# Patient Record
Sex: Female | Born: 1940 | Race: Black or African American | Hispanic: No | Marital: Married
Health system: Southern US, Community
[De-identification: ages and names within clinical notes are randomized; demographics above are authoritative.]

---

## 2004-07-27 ENCOUNTER — Ambulatory Visit: Payer: Self-pay | Admitting: Internal Medicine

## 2005-06-11 ENCOUNTER — Ambulatory Visit: Payer: Self-pay | Admitting: Internal Medicine

## 2005-07-02 ENCOUNTER — Ambulatory Visit: Payer: Self-pay | Admitting: Oncology

## 2005-07-05 ENCOUNTER — Ambulatory Visit: Payer: Self-pay | Admitting: General Surgery

## 2005-07-18 ENCOUNTER — Ambulatory Visit: Payer: Self-pay | Admitting: Oncology

## 2005-08-15 ENCOUNTER — Ambulatory Visit: Payer: Self-pay | Admitting: Oncology

## 2005-09-15 ENCOUNTER — Ambulatory Visit: Payer: Self-pay | Admitting: Oncology

## 2005-10-15 ENCOUNTER — Ambulatory Visit: Payer: Self-pay | Admitting: Oncology

## 2005-11-15 ENCOUNTER — Ambulatory Visit: Payer: Self-pay | Admitting: Oncology

## 2005-11-28 ENCOUNTER — Ambulatory Visit: Payer: Self-pay | Admitting: General Surgery

## 2005-12-27 ENCOUNTER — Ambulatory Visit: Payer: Self-pay | Admitting: Oncology

## 2006-01-15 ENCOUNTER — Ambulatory Visit: Payer: Self-pay | Admitting: Oncology

## 2006-02-15 ENCOUNTER — Ambulatory Visit: Payer: Self-pay | Admitting: Oncology

## 2006-03-17 ENCOUNTER — Ambulatory Visit: Payer: Self-pay | Admitting: Oncology

## 2006-04-17 ENCOUNTER — Ambulatory Visit: Payer: Self-pay | Admitting: Oncology

## 2006-05-17 ENCOUNTER — Ambulatory Visit: Payer: Self-pay | Admitting: Oncology

## 2006-07-04 ENCOUNTER — Ambulatory Visit: Payer: Self-pay | Admitting: General Surgery

## 2006-07-11 ENCOUNTER — Ambulatory Visit: Payer: Self-pay | Admitting: Oncology

## 2006-07-18 ENCOUNTER — Ambulatory Visit: Payer: Self-pay | Admitting: Oncology

## 2006-09-16 ENCOUNTER — Ambulatory Visit: Payer: Self-pay | Admitting: Oncology

## 2006-09-17 ENCOUNTER — Ambulatory Visit: Payer: Self-pay | Admitting: Oncology

## 2006-10-16 ENCOUNTER — Ambulatory Visit: Payer: Self-pay | Admitting: Oncology

## 2007-01-08 ENCOUNTER — Ambulatory Visit: Payer: Self-pay | Admitting: Oncology

## 2007-01-16 ENCOUNTER — Ambulatory Visit: Payer: Self-pay | Admitting: Oncology

## 2007-02-16 ENCOUNTER — Ambulatory Visit: Payer: Self-pay | Admitting: Oncology

## 2007-02-19 ENCOUNTER — Ambulatory Visit: Payer: Self-pay | Admitting: General Surgery

## 2007-03-19 ENCOUNTER — Ambulatory Visit: Payer: Self-pay | Admitting: Radiation Oncology

## 2007-04-18 ENCOUNTER — Ambulatory Visit: Payer: Self-pay | Admitting: Radiation Oncology

## 2007-06-18 ENCOUNTER — Ambulatory Visit: Payer: Self-pay | Admitting: Oncology

## 2007-07-01 ENCOUNTER — Ambulatory Visit: Payer: Self-pay | Admitting: General Surgery

## 2007-07-10 ENCOUNTER — Ambulatory Visit: Payer: Self-pay | Admitting: Oncology

## 2007-07-19 ENCOUNTER — Ambulatory Visit: Payer: Self-pay | Admitting: Oncology

## 2007-11-16 ENCOUNTER — Ambulatory Visit: Payer: Self-pay | Admitting: Oncology

## 2007-12-16 ENCOUNTER — Ambulatory Visit: Payer: Self-pay | Admitting: Oncology

## 2008-01-11 ENCOUNTER — Ambulatory Visit: Payer: Self-pay | Admitting: Internal Medicine

## 2008-01-18 ENCOUNTER — Emergency Department: Payer: Self-pay | Admitting: Internal Medicine

## 2008-01-18 ENCOUNTER — Other Ambulatory Visit: Payer: Self-pay

## 2008-02-16 ENCOUNTER — Ambulatory Visit: Payer: Self-pay | Admitting: Radiation Oncology

## 2008-03-16 ENCOUNTER — Ambulatory Visit: Payer: Self-pay | Admitting: Oncology

## 2008-03-17 ENCOUNTER — Ambulatory Visit: Payer: Self-pay | Admitting: Oncology

## 2008-04-15 ENCOUNTER — Ambulatory Visit: Payer: Self-pay | Admitting: Oncology

## 2008-04-17 ENCOUNTER — Ambulatory Visit: Payer: Self-pay | Admitting: Oncology

## 2008-07-04 ENCOUNTER — Ambulatory Visit: Payer: Self-pay | Admitting: Internal Medicine

## 2008-10-15 ENCOUNTER — Ambulatory Visit: Payer: Self-pay | Admitting: Oncology

## 2008-10-19 ENCOUNTER — Ambulatory Visit: Payer: Self-pay | Admitting: Oncology

## 2008-11-15 ENCOUNTER — Ambulatory Visit: Payer: Self-pay | Admitting: Oncology

## 2009-03-17 ENCOUNTER — Ambulatory Visit: Payer: Self-pay | Admitting: Radiation Oncology

## 2009-03-23 ENCOUNTER — Ambulatory Visit: Payer: Self-pay | Admitting: Radiation Oncology

## 2009-04-17 ENCOUNTER — Ambulatory Visit: Payer: Self-pay | Admitting: Radiation Oncology

## 2009-04-19 ENCOUNTER — Ambulatory Visit: Payer: Self-pay | Admitting: Oncology

## 2009-05-16 ENCOUNTER — Ambulatory Visit: Payer: Self-pay | Admitting: General Surgery

## 2009-05-17 ENCOUNTER — Ambulatory Visit: Payer: Self-pay | Admitting: Radiation Oncology

## 2009-05-17 ENCOUNTER — Ambulatory Visit: Payer: Self-pay | Admitting: Oncology

## 2009-05-18 ENCOUNTER — Ambulatory Visit: Payer: Self-pay | Admitting: Oncology

## 2009-06-17 ENCOUNTER — Ambulatory Visit: Payer: Self-pay | Admitting: Oncology

## 2009-06-17 ENCOUNTER — Ambulatory Visit: Payer: Self-pay | Admitting: Radiation Oncology

## 2009-07-06 ENCOUNTER — Ambulatory Visit: Payer: Self-pay | Admitting: Internal Medicine

## 2009-07-18 ENCOUNTER — Ambulatory Visit: Payer: Self-pay | Admitting: Oncology

## 2009-07-18 ENCOUNTER — Ambulatory Visit: Payer: Self-pay | Admitting: Radiation Oncology

## 2009-08-15 ENCOUNTER — Ambulatory Visit: Payer: Self-pay | Admitting: Oncology

## 2009-08-15 ENCOUNTER — Ambulatory Visit: Payer: Self-pay | Admitting: Radiation Oncology

## 2009-09-15 ENCOUNTER — Ambulatory Visit: Payer: Self-pay | Admitting: Radiation Oncology

## 2009-09-15 ENCOUNTER — Ambulatory Visit: Payer: Self-pay | Admitting: Oncology

## 2009-10-15 ENCOUNTER — Ambulatory Visit: Payer: Self-pay | Admitting: Radiation Oncology

## 2009-10-15 ENCOUNTER — Ambulatory Visit: Payer: Self-pay | Admitting: Oncology

## 2009-11-15 ENCOUNTER — Ambulatory Visit: Payer: Self-pay | Admitting: Radiation Oncology

## 2009-11-15 ENCOUNTER — Ambulatory Visit: Payer: Self-pay | Admitting: Oncology

## 2009-12-15 ENCOUNTER — Ambulatory Visit: Payer: Self-pay | Admitting: Radiation Oncology

## 2010-01-09 ENCOUNTER — Ambulatory Visit: Payer: Self-pay | Admitting: Oncology

## 2010-01-11 LAB — CANCER ANTIGEN 27.29: CA 27.29: 27.3 U/mL (ref 0.0–38.6)

## 2010-01-15 ENCOUNTER — Ambulatory Visit: Payer: Self-pay | Admitting: Radiation Oncology

## 2010-01-15 ENCOUNTER — Ambulatory Visit: Payer: Self-pay | Admitting: Oncology

## 2010-02-15 ENCOUNTER — Ambulatory Visit: Payer: Self-pay | Admitting: Radiation Oncology

## 2010-02-15 ENCOUNTER — Ambulatory Visit: Payer: Self-pay | Admitting: Oncology

## 2010-03-17 ENCOUNTER — Ambulatory Visit: Payer: Self-pay | Admitting: Oncology

## 2010-03-17 ENCOUNTER — Ambulatory Visit: Payer: Self-pay | Admitting: Radiation Oncology

## 2010-03-22 LAB — CANCER ANTIGEN 27.29: CA 27.29: 21.4 U/mL (ref 0.0–38.6)

## 2010-04-17 ENCOUNTER — Ambulatory Visit: Payer: Self-pay | Admitting: Oncology

## 2010-04-17 ENCOUNTER — Ambulatory Visit: Payer: Self-pay | Admitting: Radiation Oncology

## 2010-04-19 LAB — CANCER ANTIGEN 27.29: CA 27.29: 19.9 U/mL (ref 0.0–38.6)

## 2010-05-17 ENCOUNTER — Ambulatory Visit: Payer: Self-pay | Admitting: Oncology

## 2010-05-17 ENCOUNTER — Ambulatory Visit: Payer: Self-pay | Admitting: Radiation Oncology

## 2010-06-14 LAB — CANCER ANTIGEN 27.29: CA 27.29: 19.1 U/mL (ref 0.0–38.6)

## 2010-06-17 ENCOUNTER — Ambulatory Visit: Payer: Self-pay | Admitting: Radiation Oncology

## 2010-07-04 ENCOUNTER — Ambulatory Visit: Payer: Self-pay | Admitting: Oncology

## 2010-07-10 ENCOUNTER — Ambulatory Visit: Payer: Self-pay | Admitting: Internal Medicine

## 2010-07-11 ENCOUNTER — Ambulatory Visit: Payer: Self-pay | Admitting: Oncology

## 2010-07-16 ENCOUNTER — Encounter: Payer: Self-pay | Admitting: Nurse Practitioner

## 2010-07-18 ENCOUNTER — Ambulatory Visit: Payer: Self-pay | Admitting: Radiation Oncology

## 2010-07-18 ENCOUNTER — Ambulatory Visit: Payer: Self-pay | Admitting: Oncology

## 2010-07-20 ENCOUNTER — Encounter: Payer: Self-pay | Admitting: Nurse Practitioner

## 2010-08-16 ENCOUNTER — Ambulatory Visit: Payer: Self-pay | Admitting: Radiation Oncology

## 2010-08-17 ENCOUNTER — Encounter: Payer: Self-pay | Admitting: Nurse Practitioner

## 2010-08-26 ENCOUNTER — Emergency Department: Payer: Self-pay | Admitting: Emergency Medicine

## 2010-08-28 ENCOUNTER — Ambulatory Visit: Payer: Self-pay | Admitting: Oncology

## 2010-09-12 ENCOUNTER — Ambulatory Visit: Payer: Self-pay | Admitting: Oncology

## 2010-09-16 ENCOUNTER — Ambulatory Visit: Payer: Self-pay | Admitting: Oncology

## 2010-09-16 ENCOUNTER — Ambulatory Visit: Payer: Self-pay | Admitting: Radiation Oncology

## 2010-10-03 ENCOUNTER — Encounter: Payer: Self-pay | Admitting: Nurse Practitioner

## 2010-10-16 ENCOUNTER — Ambulatory Visit: Payer: Self-pay | Admitting: Radiation Oncology

## 2010-10-17 ENCOUNTER — Encounter: Payer: Self-pay | Admitting: Nurse Practitioner

## 2010-10-18 ENCOUNTER — Ambulatory Visit: Payer: Self-pay | Admitting: Oncology

## 2010-11-16 ENCOUNTER — Ambulatory Visit: Payer: Self-pay | Admitting: Oncology

## 2010-11-16 ENCOUNTER — Ambulatory Visit: Payer: Self-pay | Admitting: Radiation Oncology

## 2010-11-22 DIAGNOSIS — Z09 Encounter for follow-up examination after completed treatment for conditions other than malignant neoplasm: Secondary | ICD-10-CM

## 2010-12-16 ENCOUNTER — Ambulatory Visit: Payer: Self-pay | Admitting: Oncology

## 2010-12-16 ENCOUNTER — Ambulatory Visit: Payer: Self-pay | Admitting: Radiation Oncology

## 2011-01-16 ENCOUNTER — Ambulatory Visit: Payer: Self-pay | Admitting: Oncology

## 2011-01-16 ENCOUNTER — Ambulatory Visit: Payer: Self-pay | Admitting: Radiation Oncology

## 2011-02-16 ENCOUNTER — Ambulatory Visit: Payer: Self-pay | Admitting: Oncology

## 2011-02-16 ENCOUNTER — Ambulatory Visit: Payer: Self-pay | Admitting: Radiation Oncology

## 2011-03-18 ENCOUNTER — Ambulatory Visit: Payer: Self-pay | Admitting: Radiation Oncology

## 2011-03-18 ENCOUNTER — Ambulatory Visit: Payer: Self-pay | Admitting: Oncology

## 2011-04-16 ENCOUNTER — Ambulatory Visit: Payer: Self-pay | Admitting: Oncology

## 2011-04-18 ENCOUNTER — Ambulatory Visit: Payer: Self-pay | Admitting: Oncology

## 2011-04-18 ENCOUNTER — Ambulatory Visit: Payer: Self-pay | Admitting: Radiation Oncology

## 2011-05-18 ENCOUNTER — Ambulatory Visit: Payer: Self-pay | Admitting: Radiation Oncology

## 2011-05-18 ENCOUNTER — Ambulatory Visit: Payer: Self-pay | Admitting: Oncology

## 2011-05-24 ENCOUNTER — Encounter: Payer: Self-pay | Admitting: Nurse Practitioner

## 2011-05-24 ENCOUNTER — Encounter: Payer: Self-pay | Admitting: Cardiothoracic Surgery

## 2011-06-18 ENCOUNTER — Ambulatory Visit: Payer: Self-pay | Admitting: Oncology

## 2011-06-18 ENCOUNTER — Ambulatory Visit: Payer: Self-pay | Admitting: Radiation Oncology

## 2011-06-21 ENCOUNTER — Ambulatory Visit: Payer: Self-pay | Admitting: Oncology

## 2011-06-21 LAB — CBC CANCER CENTER
Basophil #: 0 x10 3/mm (ref 0.0–0.1)
HGB: 8.7 g/dL — ABNORMAL LOW (ref 12.0–16.0)
Lymphocyte #: 0.6 x10 3/mm — ABNORMAL LOW (ref 1.0–3.6)
MCH: 32.1 pg (ref 26.0–34.0)
MCV: 95 fL (ref 80–100)
Monocyte #: 1 x10 3/mm — ABNORMAL HIGH (ref 0.0–0.7)
Neutrophil %: 42.3 %
Platelet: 514 x10 3/mm — ABNORMAL HIGH (ref 150–440)
RDW: 21.5 % — ABNORMAL HIGH (ref 11.5–14.5)
WBC: 2.7 x10 3/mm — ABNORMAL LOW (ref 3.6–11.0)

## 2011-06-28 LAB — WOUND CULTURE

## 2011-07-05 LAB — COMPREHENSIVE METABOLIC PANEL
BUN: 10 mg/dL (ref 7–18)
Bilirubin,Total: 0.2 mg/dL (ref 0.2–1.0)
Chloride: 98 mmol/L (ref 98–107)
Co2: 29 mmol/L (ref 21–32)
Creatinine: 1.03 mg/dL (ref 0.60–1.30)
EGFR (African American): >60
EGFR (Non-African Amer.): 57 — ABNORMAL LOW
Glucose: 87 mg/dL (ref 65–99)
Osmolality: 270 (ref 275–301)
Potassium: 3.2 mmol/L — ABNORMAL LOW (ref 3.5–5.1)
Sodium: 136 mmol/L (ref 136–145)
Total Protein: 7.3 g/dL (ref 6.4–8.2)

## 2011-07-05 LAB — CBC CANCER CENTER
Basophil %: 0.4 %
Eosinophil #: 0.4 x10 3/mm (ref 0.0–0.7)
Eosinophil %: 5.5 %
HCT: 27.1 % — ABNORMAL LOW (ref 35.0–47.0)
HGB: 9 g/dL — ABNORMAL LOW (ref 12.0–16.0)
Lymphocyte %: 16.8 %
MCH: 32.2 pg (ref 26.0–34.0)
MCHC: 33.1 g/dL (ref 32.0–36.0)
Monocyte #: 0.9 x10 3/mm — ABNORMAL HIGH (ref 0.0–0.7)
Monocyte %: 14.5 %
Neutrophil #: 4.1 x10 3/mm (ref 1.4–6.5)
Neutrophil %: 62.8 %
RBC: 2.78 10*6/uL — ABNORMAL LOW (ref 3.80–5.20)

## 2011-07-15 LAB — CBC CANCER CENTER
Basophil #: 0 x10 3/mm (ref 0.0–0.1)
Basophil %: 0.3 %
Eosinophil #: 0.2 x10 3/mm (ref 0.0–0.7)
Eosinophil %: 14 %
HGB: 7.8 g/dL — ABNORMAL LOW (ref 12.0–16.0)
Lymphocyte #: 0.5 x10 3/mm — ABNORMAL LOW (ref 1.0–3.6)
Lymphocyte %: 29.9 %
Monocyte #: 0.2 x10 3/mm (ref 0.0–0.7)
Monocyte %: 10.6 %
Neutrophil #: 0.7 x10 3/mm — ABNORMAL LOW (ref 1.4–6.5)
Neutrophil %: 45.2 %
RBC: 2.41 10*6/uL — ABNORMAL LOW (ref 3.80–5.20)
RDW: 22.4 % — ABNORMAL HIGH (ref 11.5–14.5)
WBC: 1.6 x10 3/mm — CL (ref 3.6–11.0)

## 2011-07-19 ENCOUNTER — Ambulatory Visit: Payer: Self-pay | Admitting: Oncology

## 2011-07-19 ENCOUNTER — Ambulatory Visit: Payer: Self-pay | Admitting: Radiation Oncology

## 2011-07-19 LAB — CBC CANCER CENTER
Basophil %: 0.2 %
Eosinophil #: 0.1 x10 3/mm (ref 0.0–0.7)
HCT: 33.2 % — ABNORMAL LOW (ref 35.0–47.0)
HGB: 10.9 g/dL — ABNORMAL LOW (ref 12.0–16.0)
Lymphocyte %: 27.5 %
MCHC: 32.8 g/dL (ref 32.0–36.0)
Monocyte %: 26.9 %
Neutrophil %: 38.7 %
Platelet: 439 x10 3/mm (ref 150–440)
RBC: 3.52 10*6/uL — ABNORMAL LOW (ref 3.80–5.20)
WBC: 2.1 x10 3/mm — ABNORMAL LOW (ref 3.6–11.0)

## 2011-07-26 LAB — CBC CANCER CENTER
Basophil %: 0.3 %
Eosinophil #: 0.2 x10 3/mm (ref 0.0–0.7)
Eosinophil %: 3 %
HCT: 33.3 % — ABNORMAL LOW (ref 35.0–47.0)
HGB: 10.7 g/dL — ABNORMAL LOW (ref 12.0–16.0)
Lymphocyte #: 0.9 x10 3/mm — ABNORMAL LOW (ref 1.0–3.6)
MCH: 30.8 pg (ref 26.0–34.0)
MCHC: 32.2 g/dL (ref 32.0–36.0)
MCV: 96 fL (ref 80–100)
Monocyte #: 1.1 x10 3/mm — ABNORMAL HIGH (ref 0.0–0.7)
Monocyte %: 16 %
Neutrophil #: 4.4 x10 3/mm (ref 1.4–6.5)
Platelet: 554 x10 3/mm — ABNORMAL HIGH (ref 150–440)
RBC: 3.48 10*6/uL — ABNORMAL LOW (ref 3.80–5.20)
WBC: 6.7 x10 3/mm (ref 3.6–11.0)

## 2011-08-02 LAB — CBC CANCER CENTER
Basophil %: 0.3 %
Eosinophil #: 0.4 x10 3/mm (ref 0.0–0.7)
HCT: 32.8 % — ABNORMAL LOW (ref 35.0–47.0)
HGB: 10.7 g/dL — ABNORMAL LOW (ref 12.0–16.0)
Lymphocyte #: 1 x10 3/mm (ref 1.0–3.6)
MCH: 31.5 pg (ref 26.0–34.0)
MCHC: 32.5 g/dL (ref 32.0–36.0)
MCV: 96.7 fL (ref 80–100)
Monocyte #: 1 x10 3/mm — ABNORMAL HIGH (ref 0.0–0.7)
Neutrophil #: 5.4 x10 3/mm (ref 1.4–6.5)
Neutrophil %: 69.2 %
WBC: 7.8 x10 3/mm (ref 3.6–11.0)

## 2011-08-02 LAB — COMPREHENSIVE METABOLIC PANEL
Alkaline Phosphatase: 159 U/L — ABNORMAL HIGH (ref 50–136)
Anion Gap: 4 — ABNORMAL LOW (ref 7–16)
Bilirubin,Total: 0.2 mg/dL (ref 0.2–1.0)
Calcium, Total: 9 mg/dL (ref 8.5–10.1)
Chloride: 104 mmol/L (ref 98–107)
Co2: 32 mmol/L (ref 21–32)
EGFR (African American): >60
Glucose: 136 mg/dL — ABNORMAL HIGH (ref 65–99)
Potassium: 3.6 mmol/L (ref 3.5–5.1)
Sodium: 140 mmol/L (ref 136–145)

## 2011-08-13 LAB — CBC CANCER CENTER
Basophil %: 0.1 %
Eosinophil #: 0.2 x10 3/mm (ref 0.0–0.7)
Eosinophil %: 9.5 %
HCT: 30.5 % — ABNORMAL LOW (ref 35.0–47.0)
HGB: 10.2 g/dL — ABNORMAL LOW (ref 12.0–16.0)
Lymphocyte #: 0.7 x10 3/mm — ABNORMAL LOW (ref 1.0–3.6)
Lymphocyte %: 32.2 %
MCH: 32.1 pg (ref 26.0–34.0)
MCHC: 33.3 g/dL (ref 32.0–36.0)
MCV: 96 fL (ref 80–100)
Monocyte #: 0.3 x10 3/mm (ref 0.0–0.7)
Neutrophil #: 1 x10 3/mm — ABNORMAL LOW (ref 1.4–6.5)
Neutrophil %: 43.7 %
RBC: 3.17 10*6/uL — ABNORMAL LOW (ref 3.80–5.20)
WBC: 2.3 x10 3/mm — ABNORMAL LOW (ref 3.6–11.0)

## 2011-08-16 ENCOUNTER — Ambulatory Visit: Payer: Self-pay | Admitting: Radiation Oncology

## 2011-08-16 ENCOUNTER — Ambulatory Visit: Payer: Self-pay | Admitting: Oncology

## 2011-08-16 LAB — CBC CANCER CENTER
Basophil #: 0 x10 3/mm (ref 0.0–0.1)
Eosinophil #: 0.2 x10 3/mm (ref 0.0–0.7)
Eosinophil: 6 %
HCT: 30.4 % — ABNORMAL LOW (ref 35.0–47.0)
HGB: 10.3 g/dL — ABNORMAL LOW (ref 12.0–16.0)
Lymphocyte #: 0.6 x10 3/mm — ABNORMAL LOW (ref 1.0–3.6)
Lymphocyte %: 21.7 %
MCH: 32.5 pg (ref 26.0–34.0)
MCHC: 33.7 g/dL (ref 32.0–36.0)
MCV: 96 fL (ref 80–100)
Monocyte #: 0.7 x10 3/mm (ref 0.0–0.7)
Monocytes: 19 %
Neutrophil #: 1.3 x10 3/mm — ABNORMAL LOW (ref 1.4–6.5)
RBC: 3.15 10*6/uL — ABNORMAL LOW (ref 3.80–5.20)
RDW: 22.4 % — ABNORMAL HIGH (ref 11.5–14.5)
WBC: 2.9 x10 3/mm — ABNORMAL LOW (ref 3.6–11.0)

## 2011-08-23 LAB — CBC CANCER CENTER
Basophil #: 0 x10 3/mm (ref 0.0–0.1)
Basophil %: 0.1 %
Eosinophil #: 0.2 x10 3/mm (ref 0.0–0.7)
Eosinophil %: 3.1 %
HCT: 31.1 % — ABNORMAL LOW (ref 35.0–47.0)
Lymphocyte #: 0.8 x10 3/mm — ABNORMAL LOW (ref 1.0–3.6)
MCHC: 33.7 g/dL (ref 32.0–36.0)
MCV: 97 fL (ref 80–100)
Monocyte %: 11.8 %
Neutrophil #: 4.6 x10 3/mm (ref 1.4–6.5)
Neutrophil %: 72.5 %
RBC: 3.2 10*6/uL — ABNORMAL LOW (ref 3.80–5.20)
RDW: 22.2 % — ABNORMAL HIGH (ref 11.5–14.5)

## 2011-08-30 LAB — CBC CANCER CENTER
Basophil #: 0 x10 3/mm (ref 0.0–0.1)
Basophil %: 0.5 %
Eosinophil #: 0.1 x10 3/mm (ref 0.0–0.7)
HCT: 32.9 % — ABNORMAL LOW (ref 35.0–47.0)
Lymphocyte %: 11.4 %
MCHC: 32.8 g/dL (ref 32.0–36.0)
Monocyte %: 8.9 %
Neutrophil %: 77.4 %
Platelet: 344 x10 3/mm (ref 150–440)
RBC: 3.32 10*6/uL — ABNORMAL LOW (ref 3.80–5.20)
RDW: 22.2 % — ABNORMAL HIGH (ref 11.5–14.5)

## 2011-08-30 LAB — COMPREHENSIVE METABOLIC PANEL
Alkaline Phosphatase: 153 U/L — ABNORMAL HIGH (ref 50–136)
Bilirubin,Total: 0.2 mg/dL (ref 0.2–1.0)
Calcium, Total: 9 mg/dL (ref 8.5–10.1)
Chloride: 101 mmol/L (ref 98–107)
Co2: 28 mmol/L (ref 21–32)
Creatinine: 1.14 mg/dL (ref 0.60–1.30)
EGFR (African American): >60
EGFR (Non-African Amer.): 50 — ABNORMAL LOW
Glucose: 129 mg/dL — ABNORMAL HIGH (ref 65–99)
SGPT (ALT): 23 U/L

## 2011-09-06 LAB — CBC CANCER CENTER
Basophil #: 0 x10 3/mm (ref 0.0–0.1)
Eosinophil #: 0.1 x10 3/mm (ref 0.0–0.7)
Eosinophil %: 4.4 %
Lymphocyte #: 0.4 x10 3/mm — ABNORMAL LOW (ref 1.0–3.6)
Lymphocyte %: 13.7 %
Lymphocytes: 15 %
MCH: 33.1 pg (ref 26.0–34.0)
MCHC: 33.7 g/dL (ref 32.0–36.0)
MCV: 98 fL (ref 80–100)
Monocyte #: 0 x10 3/mm (ref 0.0–0.7)
Neutrophil %: 80.8 %
Platelet: 178 x10 3/mm (ref 150–440)
RBC: 2.96 10*6/uL — ABNORMAL LOW (ref 3.80–5.20)
RDW: 21.6 % — ABNORMAL HIGH (ref 11.5–14.5)
Segmented Neutrophils: 78 %

## 2011-09-12 LAB — CBC CANCER CENTER
Basophil #: 0 x10 3/mm (ref 0.0–0.1)
Eosinophil #: 0.1 x10 3/mm (ref 0.0–0.7)
HGB: 9.4 g/dL — ABNORMAL LOW (ref 12.0–16.0)
Lymphocyte %: 12.2 %
MCHC: 33.5 g/dL (ref 32.0–36.0)
WBC: 4.8 x10 3/mm (ref 3.6–11.0)

## 2011-09-16 ENCOUNTER — Ambulatory Visit: Payer: Self-pay | Admitting: Oncology

## 2011-09-16 ENCOUNTER — Ambulatory Visit: Payer: Self-pay | Admitting: Radiation Oncology

## 2011-09-20 LAB — CBC CANCER CENTER
Basophil %: 0 %
Eosinophil #: 0.2 x10 3/mm (ref 0.0–0.7)
Eosinophil %: 2.4 %
Lymphocyte #: 0.8 x10 3/mm — ABNORMAL LOW (ref 1.0–3.6)
MCH: 33.1 pg (ref 26.0–34.0)
MCV: 99 fL (ref 80–100)
Monocyte %: 13.8 %
Neutrophil #: 4.6 x10 3/mm (ref 1.4–6.5)
RBC: 2.78 10*6/uL — ABNORMAL LOW (ref 3.80–5.20)

## 2011-09-27 LAB — CBC CANCER CENTER
Basophil %: 0.6 %
HCT: 27.9 % — ABNORMAL LOW (ref 35.0–47.0)
Lymphocyte #: 1.3 x10 3/mm (ref 1.0–3.6)
Lymphocyte %: 19.2 %
MCV: 100 fL (ref 80–100)
Monocyte #: 1 x10 3/mm — ABNORMAL HIGH (ref 0.2–0.9)
Monocyte %: 14.6 %
Neutrophil %: 63.8 %
Platelet: 411 x10 3/mm (ref 150–440)
RDW: 21 % — ABNORMAL HIGH (ref 11.5–14.5)

## 2011-09-27 LAB — COMPREHENSIVE METABOLIC PANEL
Albumin: 2.9 g/dL — ABNORMAL LOW (ref 3.4–5.0)
Alkaline Phosphatase: 137 U/L — ABNORMAL HIGH (ref 50–136)
Anion Gap: 7 (ref 7–16)
BUN: 13 mg/dL (ref 7–18)
Bilirubin,Total: 0.2 mg/dL (ref 0.2–1.0)
Calcium, Total: 9.3 mg/dL (ref 8.5–10.1)
Chloride: 102 mmol/L (ref 98–107)
Co2: 29 mmol/L (ref 21–32)
EGFR (African American): >60
EGFR (Non-African Amer.): 57 — ABNORMAL LOW
Glucose: 111 mg/dL — ABNORMAL HIGH (ref 65–99)
Osmolality: 276 (ref 275–301)
Potassium: 3.5 mmol/L (ref 3.5–5.1)
Sodium: 138 mmol/L (ref 136–145)
Total Protein: 7.1 g/dL (ref 6.4–8.2)

## 2011-10-16 ENCOUNTER — Ambulatory Visit: Payer: Self-pay | Admitting: Radiation Oncology

## 2011-10-25 ENCOUNTER — Ambulatory Visit: Payer: Self-pay | Admitting: Oncology

## 2011-10-25 LAB — COMPREHENSIVE METABOLIC PANEL
Albumin: 2.9 g/dL — ABNORMAL LOW (ref 3.4–5.0)
Anion Gap: 11 (ref 7–16)
BUN: 18 mg/dL (ref 7–18)
Bilirubin,Total: 0.3 mg/dL (ref 0.2–1.0)
Calcium, Total: 9.4 mg/dL (ref 8.5–10.1)
Co2: 27 mmol/L (ref 21–32)
Creatinine: 1.28 mg/dL (ref 0.60–1.30)
EGFR (Non-African Amer.): 43 — ABNORMAL LOW
Glucose: 106 mg/dL — ABNORMAL HIGH (ref 65–99)
Osmolality: 274 (ref 275–301)
Potassium: 3.4 mmol/L — ABNORMAL LOW (ref 3.5–5.1)
SGPT (ALT): 26 U/L
Sodium: 136 mmol/L (ref 136–145)
Total Protein: 7.7 g/dL (ref 6.4–8.2)

## 2011-10-25 LAB — CBC CANCER CENTER
HGB: 10.1 g/dL — ABNORMAL LOW (ref 12.0–16.0)
Lymphocyte #: 1 x10 3/mm (ref 1.0–3.6)
MCH: 34.1 pg — ABNORMAL HIGH (ref 26.0–34.0)
MCHC: 32.9 g/dL (ref 32.0–36.0)
MCV: 103 fL — ABNORMAL HIGH (ref 80–100)
Monocyte %: 6.1 %
Neutrophil %: 82.7 %
Platelet: 402 x10 3/mm (ref 150–440)
RBC: 2.96 10*6/uL — ABNORMAL LOW (ref 3.80–5.20)
WBC: 11.7 x10 3/mm — ABNORMAL HIGH (ref 3.6–11.0)

## 2011-11-08 ENCOUNTER — Encounter: Payer: Self-pay | Admitting: Nurse Practitioner

## 2011-11-08 ENCOUNTER — Encounter: Payer: Self-pay | Admitting: Cardiothoracic Surgery

## 2011-11-16 ENCOUNTER — Ambulatory Visit: Payer: Self-pay | Admitting: Oncology

## 2011-11-16 ENCOUNTER — Ambulatory Visit: Payer: Self-pay | Admitting: Radiation Oncology

## 2011-11-22 LAB — CBC CANCER CENTER
Basophil #: 0 10*3/uL
Basophil %: 0.3 %
Eosinophil #: 0.2 10*3/uL
Eosinophil %: 1.3 %
HCT: 28.3 % — ABNORMAL LOW
HGB: 9.3 g/dL — ABNORMAL LOW
Lymphocyte %: 9 %
Lymphs Abs: 1.2 10*3/uL
MCH: 34.5 pg — ABNORMAL HIGH
MCHC: 32.9 g/dL
MCV: 105 fL — ABNORMAL HIGH
Monocyte #: 0.9 10*3/uL
Monocyte %: 6.7 %
Neutrophil #: 11 10*3/uL — ABNORMAL HIGH
Neutrophil %: 82.7 %
Platelet: 519 10*3/uL — ABNORMAL HIGH
RBC: 2.7 10*6/uL — ABNORMAL LOW
RDW: 14 %
WBC: 13.3 10*3/uL — ABNORMAL HIGH

## 2011-11-22 LAB — COMPREHENSIVE METABOLIC PANEL
Albumin: 2.6 g/dL — ABNORMAL LOW (ref 3.4–5.0)
Anion Gap: 9 (ref 7–16)
BUN: 12 mg/dL (ref 7–18)
Bilirubin,Total: 0.2 mg/dL (ref 0.2–1.0)
Creatinine: 1.21 mg/dL (ref 0.60–1.30)
EGFR (African American): 53 — ABNORMAL LOW
EGFR (Non-African Amer.): 46 — ABNORMAL LOW
Glucose: 99 mg/dL (ref 65–99)
Osmolality: 268 (ref 275–301)
SGOT(AST): 37 U/L (ref 15–37)
SGPT (ALT): 20 U/L
Total Protein: 8.1 g/dL (ref 6.4–8.2)

## 2011-12-06 LAB — BASIC METABOLIC PANEL
Anion Gap: 6 — ABNORMAL LOW (ref 7–16)
BUN: 12 mg/dL (ref 7–18)
Calcium, Total: 9 mg/dL (ref 8.5–10.1)
Chloride: 102 mmol/L (ref 98–107)
Co2: 28 mmol/L (ref 21–32)
Creatinine: 1.1 mg/dL (ref 0.60–1.30)
EGFR (African American): 60 — ABNORMAL LOW
EGFR (Non-African Amer.): 52 — ABNORMAL LOW
Osmolality: 271 (ref 275–301)
Potassium: 3.9 mmol/L (ref 3.5–5.1)
Sodium: 136 mmol/L (ref 136–145)

## 2011-12-06 LAB — CBC CANCER CENTER
Basophil #: 0 x10 3/mm (ref 0.0–0.1)
Basophil %: 0.5 %
Eosinophil #: 0.3 x10 3/mm (ref 0.0–0.7)
HCT: 28.1 % — ABNORMAL LOW (ref 35.0–47.0)
HGB: 9.3 g/dL — ABNORMAL LOW (ref 12.0–16.0)
Lymphocyte #: 0.9 x10 3/mm — ABNORMAL LOW (ref 1.0–3.6)
Lymphocyte %: 11 %
MCH: 34.7 pg — ABNORMAL HIGH (ref 26.0–34.0)
MCHC: 33.1 g/dL (ref 32.0–36.0)
Monocyte %: 7.6 %
Neutrophil #: 6.6 x10 3/mm — ABNORMAL HIGH (ref 1.4–6.5)
Neutrophil %: 77.7 %
RBC: 2.68 10*6/uL — ABNORMAL LOW (ref 3.80–5.20)
RDW: 13.8 % (ref 11.5–14.5)

## 2011-12-16 ENCOUNTER — Ambulatory Visit: Payer: Self-pay | Admitting: Radiation Oncology

## 2011-12-16 ENCOUNTER — Ambulatory Visit: Payer: Self-pay | Admitting: Oncology

## 2011-12-20 LAB — BASIC METABOLIC PANEL
BUN: 11 mg/dL (ref 7–18)
Chloride: 102 mmol/L (ref 98–107)
Creatinine: 1.13 mg/dL (ref 0.60–1.30)
EGFR (African American): 58 — ABNORMAL LOW
EGFR (Non-African Amer.): 50 — ABNORMAL LOW
Glucose: 97 mg/dL (ref 65–99)
Potassium: 3.6 mmol/L (ref 3.5–5.1)
Sodium: 138 mmol/L (ref 136–145)

## 2011-12-20 LAB — CBC CANCER CENTER
Basophil #: 0 x10 3/mm (ref 0.0–0.1)
Basophil %: 0.2 %
Eosinophil #: 0.2 x10 3/mm (ref 0.0–0.7)
HCT: 29.5 % — ABNORMAL LOW (ref 35.0–47.0)
Lymphocyte #: 1 x10 3/mm (ref 1.0–3.6)
Lymphocyte %: 13.4 %
MCV: 104 fL — ABNORMAL HIGH (ref 80–100)
Monocyte #: 0.7 x10 3/mm (ref 0.2–0.9)
Monocyte %: 9.2 %
Neutrophil #: 5.5 x10 3/mm (ref 1.4–6.5)
Platelet: 457 x10 3/mm — ABNORMAL HIGH (ref 150–440)
RBC: 2.83 10*6/uL — ABNORMAL LOW (ref 3.80–5.20)
RDW: 14.4 % (ref 11.5–14.5)
WBC: 7.4 x10 3/mm (ref 3.6–11.0)

## 2012-01-03 LAB — COMPREHENSIVE METABOLIC PANEL
Albumin: 2.8 g/dL — ABNORMAL LOW (ref 3.4–5.0)
Alkaline Phosphatase: 151 U/L — ABNORMAL HIGH (ref 50–136)
Bilirubin,Total: 0.2 mg/dL (ref 0.2–1.0)
Calcium, Total: 8.9 mg/dL (ref 8.5–10.1)
Co2: 28 mmol/L (ref 21–32)
Creatinine: 1.23 mg/dL (ref 0.60–1.30)
EGFR (Non-African Amer.): 45 — ABNORMAL LOW
Glucose: 116 mg/dL — ABNORMAL HIGH (ref 65–99)
Osmolality: 273 (ref 275–301)
SGOT(AST): 25 U/L (ref 15–37)
SGPT (ALT): 22 U/L
Total Protein: 8 g/dL (ref 6.4–8.2)

## 2012-01-03 LAB — CBC CANCER CENTER
Basophil %: 0.3 %
Eosinophil #: 0.2 x10 3/mm (ref 0.0–0.7)
HCT: 29.8 % — ABNORMAL LOW (ref 35.0–47.0)
HGB: 10.1 g/dL — ABNORMAL LOW (ref 12.0–16.0)
Lymphocyte #: 1.4 x10 3/mm (ref 1.0–3.6)
MCH: 35.2 pg — ABNORMAL HIGH (ref 26.0–34.0)
MCHC: 33.7 g/dL (ref 32.0–36.0)
MCV: 104 fL — ABNORMAL HIGH (ref 80–100)
Monocyte #: 0.8 x10 3/mm (ref 0.2–0.9)
Neutrophil #: 4.1 x10 3/mm (ref 1.4–6.5)
RDW: 15.6 % — ABNORMAL HIGH (ref 11.5–14.5)

## 2012-01-16 ENCOUNTER — Ambulatory Visit: Payer: Self-pay | Admitting: Radiation Oncology

## 2012-01-16 ENCOUNTER — Ambulatory Visit: Payer: Self-pay | Admitting: Oncology

## 2012-01-17 LAB — COMPREHENSIVE METABOLIC PANEL
Albumin: 2.9 g/dL — ABNORMAL LOW (ref 3.4–5.0)
Bilirubin,Total: 0.1 mg/dL — ABNORMAL LOW (ref 0.2–1.0)
Chloride: 102 mmol/L (ref 98–107)
Co2: 28 mmol/L (ref 21–32)
Creatinine: 1.17 mg/dL (ref 0.60–1.30)
EGFR (African American): 55 — ABNORMAL LOW
EGFR (Non-African Amer.): 48 — ABNORMAL LOW
Glucose: 96 mg/dL (ref 65–99)
Osmolality: 275 (ref 275–301)
SGPT (ALT): 22 U/L
Total Protein: 8 g/dL (ref 6.4–8.2)

## 2012-01-17 LAB — CBC CANCER CENTER
Basophil #: 0.1 x10 3/mm (ref 0.0–0.1)
Eosinophil #: 0.2 x10 3/mm (ref 0.0–0.7)
Eosinophil %: 2.5 %
HGB: 10.8 g/dL — ABNORMAL LOW (ref 12.0–16.0)
Lymphocyte #: 1.1 x10 3/mm (ref 1.0–3.6)
MCV: 105 fL — ABNORMAL HIGH (ref 80–100)
Monocyte #: 0.6 x10 3/mm (ref 0.2–0.9)
Monocyte %: 8.6 %
Neutrophil #: 4.5 x10 3/mm (ref 1.4–6.5)
RBC: 3.11 10*6/uL — ABNORMAL LOW (ref 3.80–5.20)
RDW: 16.8 % — ABNORMAL HIGH (ref 11.5–14.5)
WBC: 6.4 x10 3/mm (ref 3.6–11.0)

## 2012-01-28 ENCOUNTER — Encounter: Payer: Self-pay | Admitting: Oncology

## 2012-01-31 LAB — CBC CANCER CENTER
Basophil %: 0.5 %
Eosinophil #: 0.2 x10 3/mm (ref 0.0–0.7)
Eosinophil %: 3.8 %
HGB: 10.9 g/dL — ABNORMAL LOW (ref 12.0–16.0)
Lymphocyte #: 1.1 x10 3/mm (ref 1.0–3.6)
Lymphocyte %: 18 %
MCH: 34.9 pg — ABNORMAL HIGH (ref 26.0–34.0)
MCV: 106 fL — ABNORMAL HIGH (ref 80–100)
Monocyte #: 0.6 x10 3/mm (ref 0.2–0.9)
Monocyte %: 9.5 %
Platelet: 364 x10 3/mm (ref 150–440)
RBC: 3.13 10*6/uL — ABNORMAL LOW (ref 3.80–5.20)
RDW: 17.8 % — ABNORMAL HIGH (ref 11.5–14.5)
WBC: 6.1 x10 3/mm (ref 3.6–11.0)

## 2012-01-31 LAB — COMPREHENSIVE METABOLIC PANEL
Anion Gap: 6 — ABNORMAL LOW (ref 7–16)
BUN: 17 mg/dL (ref 7–18)
Bilirubin,Total: 0.2 mg/dL (ref 0.2–1.0)
Calcium, Total: 9 mg/dL (ref 8.5–10.1)
Chloride: 103 mmol/L (ref 98–107)
Co2: 30 mmol/L (ref 21–32)
EGFR (African American): >60
EGFR (Non-African Amer.): 53 — ABNORMAL LOW
Potassium: 3.7 mmol/L (ref 3.5–5.1)
SGOT(AST): 23 U/L (ref 15–37)
SGPT (ALT): 19 U/L (ref 12–78)
Total Protein: 7.8 g/dL (ref 6.4–8.2)

## 2012-02-11 ENCOUNTER — Ambulatory Visit: Payer: Self-pay | Admitting: Ophthalmology

## 2012-02-14 LAB — CBC CANCER CENTER
Basophil %: 0.1 %
Eosinophil #: 0.2 x10 3/mm (ref 0.0–0.7)
Eosinophil %: 3.1 %
HCT: 32.1 % — ABNORMAL LOW (ref 35.0–47.0)
HGB: 10.7 g/dL — ABNORMAL LOW (ref 12.0–16.0)
Lymphocyte #: 1.4 x10 3/mm (ref 1.0–3.6)
MCH: 34.7 pg — ABNORMAL HIGH (ref 26.0–34.0)
MCHC: 33.3 g/dL (ref 32.0–36.0)
Monocyte #: 0.8 x10 3/mm (ref 0.2–0.9)
Neutrophil %: 66.8 %
WBC: 7.6 x10 3/mm (ref 3.6–11.0)

## 2012-02-14 LAB — BASIC METABOLIC PANEL
Anion Gap: 8 (ref 7–16)
BUN: 13 mg/dL (ref 7–18)
Calcium, Total: 9.1 mg/dL (ref 8.5–10.1)
Chloride: 103 mmol/L (ref 98–107)
Creatinine: 1.03 mg/dL (ref 0.60–1.30)
EGFR (Non-African Amer.): 56 — ABNORMAL LOW
Glucose: 125 mg/dL — ABNORMAL HIGH (ref 65–99)
Osmolality: 277 (ref 275–301)

## 2012-02-16 ENCOUNTER — Ambulatory Visit: Payer: Self-pay | Admitting: Oncology

## 2012-02-16 ENCOUNTER — Ambulatory Visit: Payer: Self-pay | Admitting: Radiation Oncology

## 2012-02-25 ENCOUNTER — Encounter: Payer: Self-pay | Admitting: Oncology

## 2012-02-28 LAB — COMPREHENSIVE METABOLIC PANEL
Albumin: 2.7 g/dL — ABNORMAL LOW (ref 3.4–5.0)
Anion Gap: 9 (ref 7–16)
Bilirubin,Total: 0.2 mg/dL (ref 0.2–1.0)
Calcium, Total: 8.8 mg/dL (ref 8.5–10.1)
Co2: 27 mmol/L (ref 21–32)
Creatinine: 1.19 mg/dL (ref 0.60–1.30)
EGFR (African American): 54 — ABNORMAL LOW
EGFR (Non-African Amer.): 47 — ABNORMAL LOW
Osmolality: 274 (ref 275–301)
Potassium: 4 mmol/L (ref 3.5–5.1)
SGOT(AST): 28 U/L (ref 15–37)

## 2012-02-28 LAB — CBC CANCER CENTER
Basophil #: 0.1 x10 3/mm (ref 0.0–0.1)
Basophil %: 1 %
Eosinophil #: 0.1 x10 3/mm (ref 0.0–0.7)
Eosinophil %: 1.7 %
HCT: 35.1 % (ref 35.0–47.0)
HGB: 11.7 g/dL — ABNORMAL LOW (ref 12.0–16.0)
Lymphocyte #: 1.1 x10 3/mm (ref 1.0–3.6)
MCH: 35.4 pg — ABNORMAL HIGH (ref 26.0–34.0)
MCV: 106 fL — ABNORMAL HIGH (ref 80–100)
Monocyte #: 0.5 x10 3/mm (ref 0.2–0.9)
Neutrophil #: 5.3 x10 3/mm (ref 1.4–6.5)
RBC: 3.31 10*6/uL — ABNORMAL LOW (ref 3.80–5.20)
RDW: 19 % — ABNORMAL HIGH (ref 11.5–14.5)

## 2012-03-13 LAB — CBC CANCER CENTER
Basophil #: 0 x10 3/mm (ref 0.0–0.1)
HCT: 33.8 % — ABNORMAL LOW (ref 35.0–47.0)
HGB: 11.1 g/dL — ABNORMAL LOW (ref 12.0–16.0)
Lymphocyte %: 12.3 %
MCH: 35 pg — ABNORMAL HIGH (ref 26.0–34.0)
MCV: 106 fL — ABNORMAL HIGH (ref 80–100)
Monocyte #: 0.8 x10 3/mm (ref 0.2–0.9)
Monocyte %: 9.3 %
Neutrophil #: 6.5 x10 3/mm (ref 1.4–6.5)
RDW: 18.5 % — ABNORMAL HIGH (ref 11.5–14.5)
WBC: 8.4 x10 3/mm (ref 3.6–11.0)

## 2012-03-13 LAB — COMPREHENSIVE METABOLIC PANEL
Albumin: 2.7 g/dL — ABNORMAL LOW (ref 3.4–5.0)
Alkaline Phosphatase: 143 U/L — ABNORMAL HIGH (ref 50–136)
Anion Gap: 9 (ref 7–16)
Calcium, Total: 9 mg/dL (ref 8.5–10.1)
EGFR (African American): >60
Glucose: 129 mg/dL — ABNORMAL HIGH (ref 65–99)
Osmolality: 273 (ref 275–301)
Potassium: 3.7 mmol/L (ref 3.5–5.1)
SGOT(AST): 26 U/L (ref 15–37)
SGPT (ALT): 18 U/L (ref 12–78)

## 2012-03-17 ENCOUNTER — Encounter: Payer: Self-pay | Admitting: Oncology

## 2012-03-17 ENCOUNTER — Ambulatory Visit: Payer: Self-pay | Admitting: Oncology

## 2012-03-17 ENCOUNTER — Ambulatory Visit: Payer: Self-pay | Admitting: Radiation Oncology

## 2012-03-18 ENCOUNTER — Ambulatory Visit: Payer: Self-pay | Admitting: Ophthalmology

## 2012-03-27 LAB — CBC CANCER CENTER
Basophil #: 0 x10 3/mm (ref 0.0–0.1)
Eosinophil %: 0.6 %
HCT: 34.1 % — ABNORMAL LOW (ref 35.0–47.0)
Lymphocyte %: 10.2 %
MCH: 34.8 pg — ABNORMAL HIGH (ref 26.0–34.0)
Monocyte %: 7.5 %
Platelet: 399 x10 3/mm (ref 150–440)
RBC: 3.21 10*6/uL — ABNORMAL LOW (ref 3.80–5.20)
RDW: 18.6 % — ABNORMAL HIGH (ref 11.5–14.5)
WBC: 9.3 x10 3/mm (ref 3.6–11.0)

## 2012-03-27 LAB — COMPREHENSIVE METABOLIC PANEL
Anion Gap: 7 (ref 7–16)
BUN: 13 mg/dL (ref 7–18)
Bilirubin,Total: 0.3 mg/dL (ref 0.2–1.0)
Chloride: 100 mmol/L (ref 98–107)
Co2: 28 mmol/L (ref 21–32)
EGFR (African American): 58 — ABNORMAL LOW
Potassium: 4 mmol/L (ref 3.5–5.1)
SGOT(AST): 35 U/L (ref 15–37)
SGPT (ALT): 20 U/L (ref 12–78)
Sodium: 135 mmol/L — ABNORMAL LOW (ref 136–145)
Total Protein: 7.7 g/dL (ref 6.4–8.2)

## 2012-03-31 ENCOUNTER — Ambulatory Visit: Payer: Self-pay | Admitting: Ophthalmology

## 2012-04-08 ENCOUNTER — Ambulatory Visit: Payer: Self-pay | Admitting: Oncology

## 2012-04-17 ENCOUNTER — Ambulatory Visit: Payer: Self-pay | Admitting: Radiation Oncology

## 2012-04-17 ENCOUNTER — Encounter: Payer: Self-pay | Admitting: Oncology

## 2012-04-17 ENCOUNTER — Ambulatory Visit: Payer: Self-pay | Admitting: Oncology

## 2012-04-24 LAB — CBC CANCER CENTER
Basophil #: 0.1 x10 3/mm (ref 0.0–0.1)
Basophil %: 0.5 %
Eosinophil #: 0.1 x10 3/mm (ref 0.0–0.7)
HCT: 32.3 % — ABNORMAL LOW (ref 35.0–47.0)
HGB: 10.7 g/dL — ABNORMAL LOW (ref 12.0–16.0)
Lymphocyte #: 1.3 x10 3/mm (ref 1.0–3.6)
Lymphocyte %: 8.5 %
MCH: 34.3 pg — ABNORMAL HIGH (ref 26.0–34.0)
MCHC: 33 g/dL (ref 32.0–36.0)
MCV: 104 fL — ABNORMAL HIGH (ref 80–100)
Monocyte #: 1.4 x10 3/mm — ABNORMAL HIGH (ref 0.2–0.9)
Neutrophil #: 12.6 x10 3/mm — ABNORMAL HIGH (ref 1.4–6.5)
Platelet: 516 x10 3/mm — ABNORMAL HIGH (ref 150–440)

## 2012-04-24 LAB — COMPREHENSIVE METABOLIC PANEL
Albumin: 2.2 g/dL — ABNORMAL LOW (ref 3.4–5.0)
Alkaline Phosphatase: 157 U/L — ABNORMAL HIGH (ref 50–136)
Anion Gap: 9 (ref 7–16)
Bilirubin,Total: 0.3 mg/dL (ref 0.2–1.0)
Calcium, Total: 8.9 mg/dL (ref 8.5–10.1)
Co2: 26 mmol/L (ref 21–32)
Creatinine: 0.97 mg/dL (ref 0.60–1.30)
EGFR (Non-African Amer.): >60
Glucose: 118 mg/dL — ABNORMAL HIGH (ref 65–99)
Osmolality: 263 (ref 275–301)
Potassium: 3.9 mmol/L (ref 3.5–5.1)
SGOT(AST): 39 U/L — ABNORMAL HIGH (ref 15–37)
Sodium: 130 mmol/L — ABNORMAL LOW (ref 136–145)

## 2012-04-29 LAB — CBC CANCER CENTER
Basophil #: 0.1 x10 3/mm (ref 0.0–0.1)
Basophil %: 0.4 %
HCT: 30.1 % — ABNORMAL LOW (ref 35.0–47.0)
HGB: 10.1 g/dL — ABNORMAL LOW (ref 12.0–16.0)
Lymphocyte #: 1.2 x10 3/mm (ref 1.0–3.6)
Lymphocyte %: 9.4 %
MCV: 103 fL — ABNORMAL HIGH (ref 80–100)
Monocyte %: 8.5 %
Neutrophil #: 10.5 x10 3/mm — ABNORMAL HIGH (ref 1.4–6.5)
Platelet: 562 x10 3/mm — ABNORMAL HIGH (ref 150–440)
RBC: 2.93 10*6/uL — ABNORMAL LOW (ref 3.80–5.20)
RDW: 16.8 % — ABNORMAL HIGH (ref 11.5–14.5)
WBC: 13 x10 3/mm — ABNORMAL HIGH (ref 3.6–11.0)

## 2012-05-08 LAB — COMPREHENSIVE METABOLIC PANEL
Albumin: 1.8 g/dL — ABNORMAL LOW (ref 3.4–5.0)
Alkaline Phosphatase: 189 U/L — ABNORMAL HIGH (ref 50–136)
Anion Gap: 11 (ref 7–16)
BUN: 18 mg/dL (ref 7–18)
Chloride: 98 mmol/L (ref 98–107)
Creatinine: 1.18 mg/dL (ref 0.60–1.30)
EGFR (African American): 55 — ABNORMAL LOW
EGFR (Non-African Amer.): 47 — ABNORMAL LOW
Glucose: 163 mg/dL — ABNORMAL HIGH (ref 65–99)
Potassium: 3.9 mmol/L (ref 3.5–5.1)
SGOT(AST): 43 U/L — ABNORMAL HIGH (ref 15–37)
SGPT (ALT): 20 U/L (ref 12–78)
Sodium: 132 mmol/L — ABNORMAL LOW (ref 136–145)
Total Protein: 7.8 g/dL (ref 6.4–8.2)

## 2012-05-08 LAB — CBC CANCER CENTER
Basophil %: 0.4 %
Eosinophil #: 0 x10 3/mm (ref 0.0–0.7)
HGB: 9.2 g/dL — ABNORMAL LOW (ref 12.0–16.0)
Lymphocyte #: 0.6 x10 3/mm — ABNORMAL LOW (ref 1.0–3.6)
Lymphocyte %: 4.3 %
MCHC: 33.2 g/dL (ref 32.0–36.0)
Monocyte %: 5.2 %
Neutrophil #: 13.5 x10 3/mm — ABNORMAL HIGH (ref 1.4–6.5)
Neutrophil %: 89.8 %
Platelet: 613 x10 3/mm — ABNORMAL HIGH (ref 150–440)
RBC: 2.68 10*6/uL — ABNORMAL LOW (ref 3.80–5.20)
RDW: 16.5 % — ABNORMAL HIGH (ref 11.5–14.5)
WBC: 15 x10 3/mm — ABNORMAL HIGH (ref 3.6–11.0)

## 2012-05-17 ENCOUNTER — Ambulatory Visit: Payer: Self-pay | Admitting: Radiation Oncology

## 2012-05-28 LAB — CBC
HCT: 26.5 % — ABNORMAL LOW (ref 35.0–47.0)
HGB: 8.6 g/dL — ABNORMAL LOW (ref 12.0–16.0)
MCH: 32.2 pg (ref 26.0–34.0)
MCV: 100 fL (ref 80–100)
RBC: 2.66 10*6/uL — ABNORMAL LOW (ref 3.80–5.20)
RDW: 15.8 % — ABNORMAL HIGH (ref 11.5–14.5)

## 2012-05-28 LAB — COMPREHENSIVE METABOLIC PANEL
Albumin: 1.8 g/dL — ABNORMAL LOW (ref 3.4–5.0)
Alkaline Phosphatase: 235 U/L — ABNORMAL HIGH (ref 50–136)
BUN: 16 mg/dL (ref 7–18)
Chloride: 100 mmol/L (ref 98–107)
Co2: 21 mmol/L (ref 21–32)
EGFR (African American): >60
Glucose: 120 mg/dL — ABNORMAL HIGH (ref 65–99)
Osmolality: 267 (ref 275–301)
SGOT(AST): 55 U/L — ABNORMAL HIGH (ref 15–37)
SGPT (ALT): 15 U/L (ref 12–78)
Total Protein: 8.5 g/dL — ABNORMAL HIGH (ref 6.4–8.2)

## 2012-05-28 LAB — URINALYSIS, COMPLETE
Bacteria: NONE SEEN
Blood: NEGATIVE
Ketone: NEGATIVE
Leukocyte Esterase: NEGATIVE
Nitrite: NEGATIVE
Protein: 30
RBC,UR: <1 /HPF (ref 0–5)
Squamous Epithelial: 7
WBC UR: 2 /HPF (ref 0–5)

## 2012-05-28 LAB — CK TOTAL AND CKMB (NOT AT ARMC): CK-MB: <0.5 ng/mL — ABNORMAL LOW (ref 0.5–3.6)

## 2012-05-29 ENCOUNTER — Inpatient Hospital Stay: Payer: Self-pay | Admitting: Family Medicine

## 2012-05-29 LAB — CBC WITH DIFFERENTIAL/PLATELET
Basophil #: 0.1 10*3/uL (ref 0.0–0.1)
Eosinophil #: 0.1 10*3/uL (ref 0.0–0.7)
HCT: 24.4 % — ABNORMAL LOW (ref 35.0–47.0)
Lymphocyte #: 1.2 10*3/uL (ref 1.0–3.6)
MCHC: 33.1 g/dL (ref 32.0–36.0)
MCV: 100 fL (ref 80–100)
Monocyte %: 8.8 %
Neutrophil #: 19.3 10*3/uL — ABNORMAL HIGH (ref 1.4–6.5)
RBC: 2.44 10*6/uL — ABNORMAL LOW (ref 3.80–5.20)
RDW: 15.9 % — ABNORMAL HIGH (ref 11.5–14.5)

## 2012-05-29 LAB — BASIC METABOLIC PANEL
BUN: 19 mg/dL — ABNORMAL HIGH (ref 7–18)
Chloride: 106 mmol/L (ref 98–107)
Creatinine: 1.14 mg/dL (ref 0.60–1.30)
EGFR (African American): 57 — ABNORMAL LOW
Glucose: 85 mg/dL (ref 65–99)
Osmolality: 270 (ref 275–301)
Potassium: 6.1 mmol/L — ABNORMAL HIGH (ref 3.5–5.1)
Sodium: 134 mmol/L — ABNORMAL LOW (ref 136–145)

## 2012-05-29 LAB — URINE CULTURE

## 2012-06-01 LAB — CREATININE, SERUM: EGFR (Non-African Amer.): >60

## 2012-06-02 ENCOUNTER — Ambulatory Visit: Payer: Self-pay | Admitting: Oncology

## 2012-06-02 LAB — CULTURE, BLOOD (SINGLE)

## 2012-06-17 ENCOUNTER — Ambulatory Visit: Payer: Self-pay | Admitting: Radiation Oncology

## 2012-07-18 DEATH — deceased

## 2013-04-08 IMAGING — CT NM PET TUM IMG RESTAG (PS) SKULL BASE T - THIGH
1 of 5 series · 1 of 25 positions shown · non-contrast
Comparison: none

REASON FOR EXAM: breast ca restaging
COMMENTS:

PROCEDURE:     PET - PET/CT RESTG BREAST CA  - June 21, 2011 [DATE]
RESULT:     Comparison: 04/16/2011
Radiopharmaceutical: 12.49 mCi F18-FDG, intravenously.
TECHNIQUE: Imaging was performed from the skull base to the mid-thigh using
routine PET/CT acquisition protocol.
Injection site: Right antecubital fossa
Time of FDG injection: 0033 hours
Serum glucose: 122 mg/dL
Time of imaging: 0033 hours through 8448 hours

[Series 102: pet wb · axial · 5.0mm · 4.07mm/px · 1 of 251 slices shown]
[im 151/251]
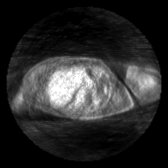

[1 of 25 positions shown; findings below may reference images not displayed]

FINDINGS: Again demonstrated is focal hypermetabolic activity associated with the
pterygoid and masseter muscles. This is similar in degree of activity and
distribution. No definite abnormality seen on the CT images. No
hypermetabolic lymph nodes identified in the neck. There is mild linear
hypermetabolic activity associated with the left strap muscles in the neck.
This is likely physiologic.

No mediastinal or hilar hypermetabolic lymph nodes identified. Multifocal
hypermetabolic activity and soft tissue thickening along the left chest wall
and left upper arm is slightly decreased from prior. Hypermetabolic activity
in the right axillary lymph node is resolved. There is interval decrease in
radiotracer activity in the 8mm nodule in the right lower lobe.

There is physiologic radiotracer activity within the bowel and urinary
system. There is focal hypermetabolic activity associated with the rectum.
This is new from prior. This may be physiologic, but is concerning given its
focal, and relative degree of activity.
IMPRESSION: 1. Slight interval decrease in the multifocal hypermetabolic activity
associated with the left chest wall and left upper arm.
2. Interval resolution of hypermetabolic activity in the right axillary
lymph node. Interval decrease in hypermetabolic activity in the
subcentimeter right lower lobe nodule.
3. Focal hypermetabolic activity associated with the rectum, new from prior.
While this can be physiologic, it is concerning given its focality and
degree of activity. Colitis is a different a consideration. Malignancy is
not excluded. Consider further evaluation with direct visualization, as
indicated.
4. Persistent hypermetabolic activity associated with the left muscles of
mastication. This is nonspecific and may be physiologic or inflammatory.

## 2014-10-04 NOTE — Discharge Summary (Signed)
PATIENT NAME:  Emma Harvey, Emma Harvey MR#:  [id number] DATE OF BIRTH:  12-06-1940  DATE OF ADMISSION:  05/29/2012 DATE OF DISCHARGE:  06/01/2012  DISPOSITION: Home with hospice.   FINAL DIAGNOSES:  1.  Chest wound, infected.  2.  Breast cancer, status post chemotherapy, radiotherapy.  3.  Systemic inflammatory response syndrome.  4.  Arm pain.  5.  Cellulitis.  6.  Anemia of chronic disease.  7.  Elevated white count.  8.  Sacral decubitus ulcer.   DISCHARGE MEDICATIONS: Multivitamin once daily, potassium chloride once daily, Senokot as needed, morphine 20 mg/mL 0.5 mg oral q.2 hours p.r.n. pain, Zofran 4 mg every 6 hours p.r.n. nausea, prednisone 20 mg once a day, metronidazole 500 mg 4 times a day for 10 days, acetaminophen p.r.n., fentanyl patch 125 mcg every 48 hours, milk of magnesia p.r.n. constipation and ciprofloxacin 250 mg twice daily.   FOLLOWUP: With hospice.   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 The patient presented with significant pain that was not being able to be kept with Vicodin, and the hospice people and nurses were not able to control. For that reason, the patient was admitted. She was put on antibiotics for treating the infection, Zosyn and vancomycin, and her pain medications were adjusted.   The patient had significant increase in white blood cells and tachycardia with leukocytosis, which met the criteria of systemic inflammatory response syndrome.   The patient also had significant fevers and was really uncomfortable. During the first day, she had significant discomfort but after her pain medications were readdressed, she had better response. Palliative care was consulted to help with this issue.   The patient needed to be discharged soon to go back on hospice, but she did not have a hospital bed or some other requirements  in her house, so we kept her here until today. Today she is able to be discharged in good condition.   TIME SPENT: I spent about 45 minutes with the patient, case manager and pharmacy today.    ____________________________ Howard Lake Sink, MD rsg:jm D: 06/01/2012 16:11:35 ET T: 06/02/2012 15:27:31 ET JOB#: [id number]  cc: Crayne Sink, MD, <Dictator> Dauntae Derusha America Brown MD ELECTRONICALLY SIGNED 06/04/2012 13:55

## 2014-10-04 NOTE — Consult Note (Signed)
History of Present Illness:   HPI   patient is here for ongoing evaluation and treatment consideration.  No abdominal pain.  No nausea.  No vomiting.  No diarrhea.condition has been declining.  Has increasing swelling of the left upper extremity.  Poor appetite.losing weight.declining performance status.was admitted in the hospital with increasing pain.  Has progressive carcinoma of breast.declining performance status  PFSH:   Family History noncontributory    Additional Past Medical and Surgical History Past Medical History: Hypertension, Two years ago patient had TIA attack from which she completely recovered.  Past Surgical History:  Hysterectomy.  Family History:  No family history of colorectal cancer, breast cancer, or ovarian cancer.   History of heart disease and hypertension in the family.  Social History:  She is a retired Radio producer. Does not smoke and does not drink.   Review of Systems:   General weakness  pain  fatigue    Performance Status (ECOG) 3    HEENT no complaints    Lungs no complaints    Cardiac no complaints    GI nausea    Musculoskeletal Increasing swelling of the left upper extremity    Extremities swelling  Left upper extremity with    Skin Multiple nodules on the chest wall    Neuro no complaints    Psych anxiety  depression   NURSING NOTES: Interdisciplinary Discharge Planning:   12-Dec-13 06:03    Advance Directive ([place]): yes    Advanced Directive in Record Manager?: Yes    Confirmed as current with patient/family: Yes  NURSING NOTES: ED Vital Sign Flow Sheet:   12-Dec-13 04:31    Pulse Pulse: 124    Respirations Respirations: 18    SBP SBP: 98    DBP DBP: 71    Pulse Ox % Pulse Ox %: 97    Pulse Ox Source Source: Room Air    Pain Scale (0-10) Pain Scale (0-10): Scale:3    Telemetry pattern Cardiac Rhythm: Tachycardia    Cardiac Monitor On: Yes   Physical Exam:   General [place] is alert  oriented lying in the bed.    HEENT: Alopecia    Lungs: rales    Cardiac: irregular rate, rhythm    Abdomen: soft  nontender  positive bowel sounds    Skin: wound    Extremities: edema  Left upper extremity lymphedema    Neuro: AAOx3  cranial nerves intact    Physical Exam Gradually declining performance status.  Multiple nodules in the chest wall open wound progressing tumor progressing left upper extremity lymphedema     Oncology Protocol: Pt participating in research protocol [id number] receiving Eribulin ([place]) weekly x2 wks, then off x1wk. Please call [phone] during or after hours for questions/problems. Yolande Jolly is primary Nutritional therapist.   Oncology Protocol: Patient is on CALGB 29191 study with drugs on Paclitaxel ( day 1, 8, and 15) and Bevacizumab (Day 1 and 15).  Pt. came off study due to progression 11/14/2009, Dec 2010   Cancer, Breast:    HTN:    Mastectomy:    Eggs: Cough  Milk: Cough  Wheat: Cough    morphine 20 mg/mL oral concentrate: 0.5 to 1 ml every one hour as needed for pain , Active, 120, None   Duragesic-100: 1 PATCH transdermal  every 72 hours, Active, 10, None   K-Dur 10 10 mEq tablet, extended release: 1 tab(s) orally once a day x 30 days , Active, 30, 2   ondansetron 4 mg  tablet: 1 tab(s) orally every 6 hours- as needed for nausea, Active, 30, 2   multivitamin: 1 tab(s) orally once a day, Active, 0, None   Senokot:  orally , As Needed, Active, 0, None   magnesium chloride: 1 tab(s) orally 3 times a day, Active, 0, None  Laboratory Results: Hepatic:  12-Dec-13 01:04    Bilirubin, Total 0.3   Alkaline Phosphatase  235   SGPT (ALT) 15   SGOT (AST)  55   Total Protein, Serum  8.5   Albumin, Serum  1.8  Routine Chem:  12-Dec-13 01:04    Glucose, Serum  120   BUN 16   Creatinine (comp) 0.74   Sodium, Serum  132   Potassium, Serum 5.0   Chloride, Serum 100   CO2, Serum 21   Calcium (Total), Serum 8.5   Osmolality  (calc) 267   eGFR (African American) >60   eGFR (Non-African American) >60 (eGFR values <65m/min/1.73 m2 may be an indication of chronic kidney disease (CKD). Calculated eGFR is useful in patients with stable renal function. The eGFR calculation will not be reliable in acutely ill patients when serum creatinine is changing rapidly. It is not useful in  patients on dialysis. The eGFR calculation may not be applicable to patients at the low and high extremes of body sizes, pregnant women, and vegetarians.)   Anion Gap 11  Cardiac:  12-Dec-13 01:04    CK, Total 54   CPK-MB, Serum  < 0.5 (Result(s) reported on 28 May 2012 at 02:45AM.)   Troponin I 0.05 (0.00-0.05 0.05 ng/mL or less: NEGATIVE  Repeat testing in 3-6 hrs  if clinically indicated. >0.05 ng/mL: POTENTIAL  MYOCARDIAL INJURY. Repeat  testing in 3-6 hrs if  clinically indicated. NOTE: An increase or decrease  of 30% or more on serial  testing suggests a  clinically important change)  Routine UA:  12-Dec-13 01:04    Color (UA) Amber   Clarity (UA) Hazy   Glucose (UA) Negative   Bilirubin (UA) Negative   Ketones (UA) Negative   Specific Gravity (UA) 1.021   Blood (UA) Negative   pH (UA) 5.0   Protein (UA) 30 mg/dL   Nitrite (UA) Negative   Leukocyte Esterase (UA) Negative (Result(s) reported on 28 May 2012 at 02:22AM.)   RBC (UA) <1 /HPF   WBC (UA) 2 /HPF   Bacteria (UA) NONE SEEN   Epithelial Cells (UA) 7 /HPF   Mucous (UA) PRESENT (Result(s) reported on 28 May 2012 at 02:22AM.)  Routine Hem:  12-Dec-13 01:04    WBC (CBC)  21.3   RBC (CBC)  2.66   Hemoglobin (CBC)  8.6   Hematocrit (CBC)  26.5   Platelet Count (CBC)  825 (Result(s) reported on 28 May 2012 at 02:05AM.)   MCV 100   MCH 32.2   MCHC 32.3   RDW  15.8   Assessment and Plan:  Impression:   1. 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   Increasing pain can be controlled with increasing fentanyl patch and Roxanol breakthrough pain medication Patient would need dressing change almost every day.  Patient can be given Flagyl and Augmentin as outpatient to control superinfection had prolonged discussion with patient and son and husband and I strongly recommended patient either go home with hospice or a hospice home.  Patient finally agreed to go home with the hospice and can be discharged  tomorrow if it is okay with admitting physician  Plan:   progressing disease.  Needs end of life care.  Electronic Signatures: Kysean Sweet, Martie Lee (MD)  (Signed 12-Dec-13 16:33)  Authored: HISTORY OF PRESENT ILLNESS, PFSH, ROS, NURSING NOTES, PE, PAST MEDICAL HISTORY, ALLERGIES, HOME MEDICATIONS, LABS, ASSESSMENT AND PLAN   Last Updated: 12-Dec-13 16:33 by Jobe Gibbon (MD)

## 2014-10-04 NOTE — Op Note (Signed)
PATIENT NAME:  Emma Harvey, Emma Harvey MR#:  [id number] DATE OF BIRTH:  January 02, 1941  DATE OF PROCEDURE:  03/31/2012  PREOPERATIVE DIAGNOSIS: Visually significant cataract of the left eye.   POSTOPERATIVE DIAGNOSIS: Visually significant cataract of the left eye.   OPERATIVE PROCEDURE: Cataract extraction by phacoemulsification with implant of intraocular lens to the left eye.   SURGEON: Galen ManilaWilliam Brenson Hartman, MD.   ANESTHESIA:  1. Managed anesthesia care.  2. Topical tetracaine drops followed by 2% Xylocaine jelly applied in the preoperative holding area.   COMPLICATIONS: None.   TECHNIQUE:  Stop and chop.  DESCRIPTION OF PROCEDURE: The patient was examined and consented in the preoperative holding area where the aforementioned topical anesthesia was applied to the left eye and then brought back to the Operating Room where the left eye was prepped and draped in the usual sterile ophthalmic fashion and a lid speculum was placed. A paracentesis was created with the side port blade and the anterior chamber was filled with viscoelastic. A near clear corneal incision was performed with the steel keratome. A continuous curvilinear capsulorrhexis was performed with a cystotome followed by the capsulorrhexis forceps. Hydrodissection and hydrodelineation were carried out with BSS on a blunt cannula. The lens was removed in a stop and chop technique and the remaining cortical material was removed with the irrigation-aspiration handpiece. The capsular bag was inflated with viscoelastic and the Tecnis ZCB00 22-diopter lens, serial number [id number] was placed in the capsular bag without complication. The remaining viscoelastic was removed from the eye with the irrigation-aspiration handpiece. The wounds were hydrated. The anterior chamber was flushed with Miostat and the eye was inflated to physiologic pressure. The wounds were found to be water tight. The eye was dressed with Vigamox. The patient was given protective  glasses to wear throughout the day and a shield with which to sleep tonight. The patient was also given drops with which to begin a drop regimen today and will follow up with me in one day.    ____________________________ Jerilee FieldWilliam L. Malina Geers, MD wlp:bjt D: 03/31/2012 11:47:19 ET T: 03/31/2012 11:52:47 ET JOB#: [id number]  cc: Taisa Deloria L. Shjon Lizarraga, MD, <Dictator> Jerilee FieldWILLIAM L Tres Grzywacz MD ELECTRONICALLY SIGNED 04/06/2012 13:46

## 2014-10-04 NOTE — Op Note (Signed)
PATIENT NAME:  Emma CostainCORBETT, Vyolet M MR#:  [id number] DATE OF BIRTH:  July 16, 1940  DATE OF PROCEDURE:  03/31/2012  ADDENDUM: Please change the type of anesthesia provided for cataract surgery of the left eye to retrobulbar block.   A moment ago I dictated it as topical. ____________________________ Jerilee FieldWilliam L. Kaylena Pacifico, MD wlp:slb D: 03/31/2012 11:50:44 ET T: 03/31/2012 12:02:50 ET JOB#: [id number]  cc: Darreld Hoffer L. Dave Mannes, MD, <Dictator> Jerilee FieldWILLIAM L Keisi Eckford MD ELECTRONICALLY SIGNED 04/06/2012 13:46

## 2014-10-07 NOTE — H&P (Signed)
PATIENT NAME:  Emma Harvey, Emma Harvey MR#:  [id number] DATE OF BIRTH:  Jul 29, 1940  DATE OF ADMISSION:  05/28/2012  PRIMARY CARE PHYSICIAN: Corky Downs, MD   PRIMARY ONCOLOGIST: Dr. Doylene Canning   CHIEF COMPLAINT: Severe left arm and chest wall pain.   HISTORY OF PRESENT ILLNESS: The patient is a 74 year old female with history of metastatic breast cancer to the chest wall with subsequent large erosive wound of the left chest wall, arm, and back who presents with progressive pain. The patient notes that her pain has become progressively worse. Initially she was managed on Vicodin, however, her pain started becoming uncontrolled and today her visiting nurse, maybe a Hospice nurse, initiated Robinul today. She was taking it every hour, however, the pain was uncontrolled. The patient describes that the pain is a constant sharp pain. She says it is about a 5 out of 10 without medications but drops to a 4 out of 10 with morphine. She presented because the pain was uncontrolled. In the Emergency Department, she has received IV morphine which has helped her pain and the patient is saying that her pain currently is a 4 out of 10.   Of note, she is notably tachycardic with heart rate in the 130's and, on further questioning, she denies fevers, chills. She admits to sweats. Regarding the wound, she changes the dressing every other day. Her sister, who is her caregiver, denies any purulence or surrounding erythema. They also state that the visiting nurse examines the wound routinely and this wound was recently examined two days ago when the dressing was changed on Tuesday and there was no concern for infection.   Regarding her breast cancer, this patient has triple negative disease that's progressed despite multiple lines of chemotherapy. Review of Dr. Aleda Grana note from November 22nd indicates that no further treatment options are available that would change the overall outcome. The patient, however, states that she has an  appointment with Dr. Doylene Canning on December 22nd to discuss further treatment options so it seems that there might be a little bit of denial as to the progression of her illness.     PAST MEDICAL HISTORY/PAST SURGICAL HISTORY:  1. Left breast cancer, triple negative disease, initially diagnosed 2007 with subsequent recurrence.  2. 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 3. History of hypertension.  4. History of hyperlipidemia.  5. Hysterectomy. 6. Left mastectomy.   ALLERGIES: No known drug allergies. She is allergic to eggs, milk, and wheat which cause cough.   MEDICATIONS:  1. Duragesic patch 100 mcg one patch transdermal every 72 hours.  2. K-Dur 10 mEq daily for 30 days. 3. Magnesium chloride three times a day.  4. Morphine 20 mg/mL oral concentrate 0.5 to 1 mL every hour as needed for pain.  5. Multivitamin daily.  6. Ondansetron 4 mg every six hours as needed for nausea.  7. Senokot p.o. as needed for constipation.   FAMILY HISTORY: Notable for hypertension, lymphoma, myocardial infarction, throat and lung cancer. She denies history of diabetes mellitus or CVA.   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 She has been married for 24 years. She denies alcohol, tobacco, or illicit drug use.   REVIEW OF SYSTEMS: CONSTITUTIONAL: Denies fevers, chills. EYES: Denies blurred vision, double vision, or eye pain. ENT: Denies tinnitus, ear pain, epistaxis. RESPIRATORY: Denies cough, wheeze, dyspnea. CARDIOVASCULAR: Denies chest pain, lower extremity edema, palpitations. GI: Denies nausea, vomiting, diarrhea. Admits to constipation. Denies melena or  hematochezia. GU: Denies dysuria or frequency. ENDOCRINE: Admits to increased sweating. Has large chronic left chest wall, back, and left arm wound. MUSCULOSKELETAL: Denies myalgias, arthralgias, or joint swelling. NEUROLOGIC: Denies dysarthria, vertigo, numbness, or headache. She admits to history  of CVA. PSYCH: Denies anxiety or depression.      PHYSICAL EXAMINATION:   VITAL SIGNS: Temperature 98.8, heart rate 150, respirations 18, blood pressure 108/80, sating 97% on room air.   GENERAL: Chronically ill appearing African American female in no apparent distress.   PSYCH: Awake, alert, and oriented x3. Judgment and insight intact.   EYES: She has exotropia of the right eye. However, pupils are equally round and reactive to light and accommodation. Extraocular muscles are intact. Anicteric sclerae.   ENT: Normal external ears and nares. Posterior oropharynx is clear.   CARDIOVASCULAR: S1, S2, tachycardic. No pretibial edema.   PULMONARY: Decreased breath sounds left lung base.    SKIN: The patient has a large foul smelling chest wall wound on the left upper extremity and also extending to the left upper back. Her left arm is in a cushion brace. She preferred that we not take down her dressing as it would cause a lot of pain. Examination of the wound margins did not reveal any erythema. There is also a 0.5 cm sacral decubitus ulcer stage II present on admission.  MUSCULOSKELETAL: There is lymphedema in the left arm. No clubbing, no cyanosis noted. Range of motion is very limited in the left upper arm. She actually does not move it due to pain.   ABDOMEN: Soft, nontender, nondistended. No organomegaly appreciated.   LABORATORY DATA: CK 54. CK-MB is less than 0.5. Troponin is 0.05. Glucose 120, BUN 16, creatinine 0.74, sodium 132, potassium 5, chloride 100, bicarb 21, calcium 8.5, bilirubin 0.5, alkaline phosphatase 235, ALT 15, AST 55, total protein 8.5, albumin 1.8, osmolality 267, anion gap 11, white count 21.3 which is increased from 15,000 when she last saw Dr. Doylene Canning on November 22nd, hemoglobin 8.6, hematocrit 26.5, platelets 825 with an MCV of 100. Urinalysis is negative for nitrites and leukocyte esterase.   Chest x-ray shows large left pleural effusion.   ASSESSMENT AND  PLAN: This is a 74 year old female with erosive stage IV metastatic breast cancer and lymphedema of the left arm being admitted for uncontrolled pain and concern of secondary bacterial infection.  1. Arm pain due to large chest wall wound from breast. Will add extended-release morphine 15 mg twice a day as well as IV Toradol. Will continue her baseline fentanyl and Robinul. Will also consult Dr. Doylene Canning.  2. Tachycardia, leukocytosis. This is chronic, however, given the increase of the wound with concern for infection and SIRS with possible sepsis from superimposed wound infection. We will send blood cultures. She has received vancomycin and Zosyn. This will be continued empirically. If blood cultures are negative, these will be discontinued.  3. Sacral decubitus wound. No signs of infection. Will consult wound care team for further management.  4. CODE STATUS. DNR/DNI. This was discussed with the patient, husband, and her sister who are her caregivers and are present at the bedside. Her surrogate decision maker is her husband, Emma Harvey.  5. DVT prophylaxis with Lovenox.  6.  Left pleural effusion. Likely malignant, no intervention. The patient is being admitted to observation status for management of uncontrolled pain and treatment of wound infection.   TIME SPENT COORDINATING ADMISSION: 50 minutes.  ____________________________ Aurther Loft, DO aeo:drc D: 05/28/2012 04:46:08 ET T:  05/28/2012 06:51:02 ET JOB#: [id number]  cc: Aurther LoftAdaorah E. Dashley Monts, DO, <Dictator> Corky DownsJaved Masoud, MD Gerome SamJanak K. Choksi, MD Meghen Akopyan E Aaidyn San DO ELECTRONICALLY SIGNED 06/19/2012 1:38
